# Patient Record
Sex: Male | Born: 2000 | Race: White | Hispanic: No | Marital: Single | Smoking: Never smoker
Health system: Southern US, Community
[De-identification: ages and names within clinical notes are randomized; demographics above are authoritative.]

## PROBLEM LIST (undated history)

## (undated) DIAGNOSIS — F32A Depression, unspecified: Secondary | ICD-10-CM

## (undated) DIAGNOSIS — F909 Attention-deficit hyperactivity disorder, unspecified type: Secondary | ICD-10-CM

## (undated) DIAGNOSIS — F329 Major depressive disorder, single episode, unspecified: Secondary | ICD-10-CM

---
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
## 2020-01-17 ENCOUNTER — Other Ambulatory Visit (HOSPITAL_BASED_OUTPATIENT_CLINIC_OR_DEPARTMENT_OTHER): Payer: Self-pay | Admitting: Neurosurgery

## 2020-01-17 DIAGNOSIS — G950 Syringomyelia and syringobulbia: Secondary | ICD-10-CM

## 2020-01-17 DIAGNOSIS — G935 Compression of brain: Secondary | ICD-10-CM

## 2020-01-25 ENCOUNTER — Encounter (HOSPITAL_BASED_OUTPATIENT_CLINIC_OR_DEPARTMENT_OTHER): Payer: Self-pay

## 2020-01-25 ENCOUNTER — Ambulatory Visit (HOSPITAL_BASED_OUTPATIENT_CLINIC_OR_DEPARTMENT_OTHER)
Admission: RE | Admit: 2020-01-25 | Discharge: 2020-01-25 | Disposition: A | Discharge status: Home / Self Care | Payer: BC Managed Care – PPO | Source: Ambulatory Visit | Attending: Neurosurgery | Admitting: Neurosurgery

## 2020-01-25 ENCOUNTER — Other Ambulatory Visit: Payer: Self-pay

## 2020-01-25 DIAGNOSIS — G935 Compression of brain: Secondary | ICD-10-CM

## 2020-01-25 DIAGNOSIS — G950 Syringomyelia and syringobulbia: Secondary | ICD-10-CM | POA: Insufficient documentation

## 2020-01-25 IMAGING — MR MR CERVICAL SPINE W/O CM
4 of 6 series · 27 of 48 positions shown · non-contrast
Comparison: None available.

CLINICAL DATA: Initial evaluation for Chiari malformation with
syrinx. Worsening balance and coordination.

EXAM:
MRI HEAD WITHOUT CONTRAST
MRI CERVICAL SPINE WITHOUT CONTRAST
MRI THORACIC SPINE WITHOUT CONTRAST
TECHNIQUE: Multiplanar, multiecho pulse sequences of the brain and surrounding
structures, and cervical spine, to include the craniocervical
junction and cervicothoracic junction, and thoracic spine were
obtained without intravenous contrast.

[Series 2: (id) tse sag · sagittal · 3.0mm · 0.41mm/px · 5 of 15 slices shown]
[im 1/15]
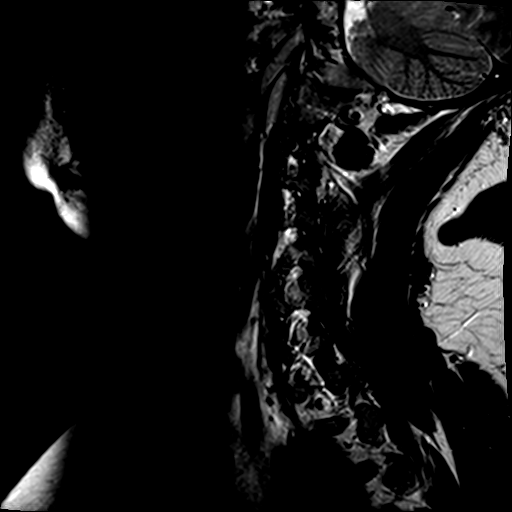
[im 4/15]
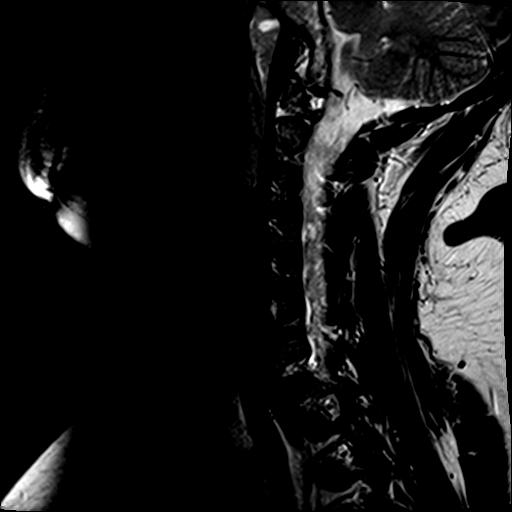
[im 8/15]
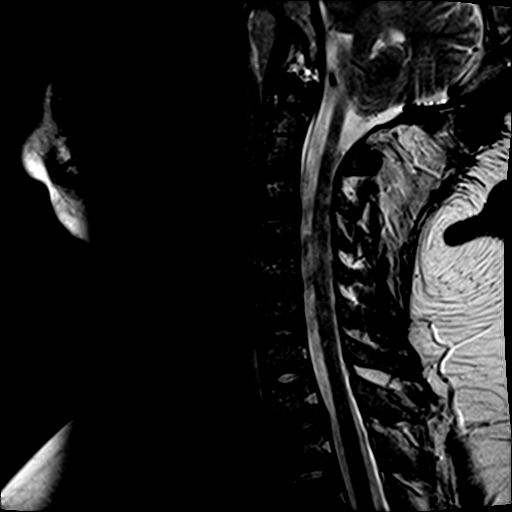
[im 11/15]
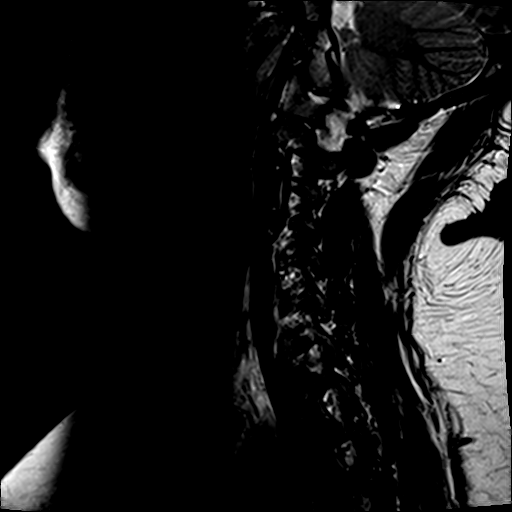
[im 15/15]
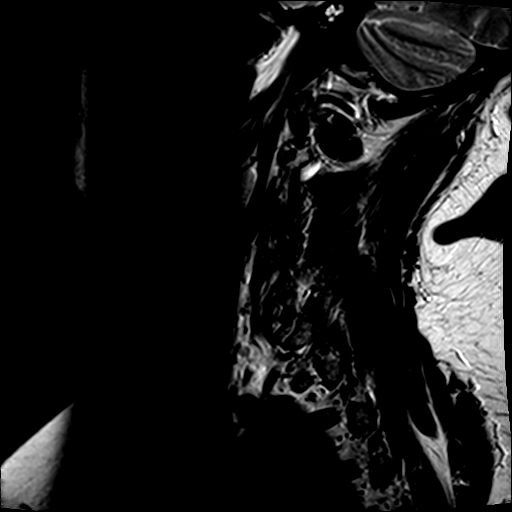

[Series 4: STIR · sagittal · 3.0mm · 0.82mm/px · 6 of 15 slices shown]
[im 1/15]
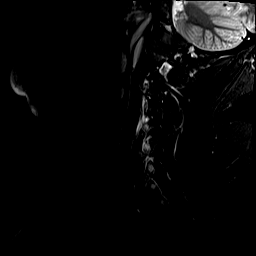
[im 3/15]
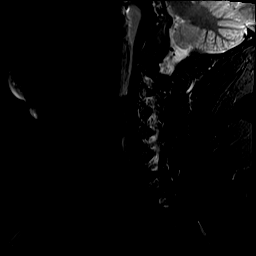
[im 6/15]
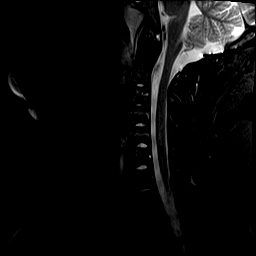
[im 9/15]
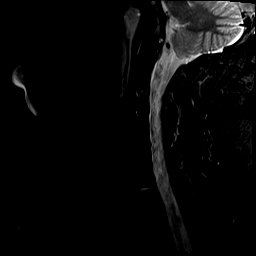
[im 12/15]
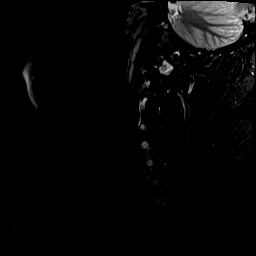
[im 15/15]
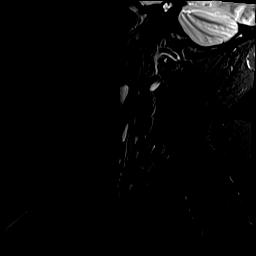

[Series 5: T2 · axial · 3.0mm · 0.39mm/px · z∈[-246,-116]mm · 8 of 38 slices shown (1 of 2)]
[im 1/38]
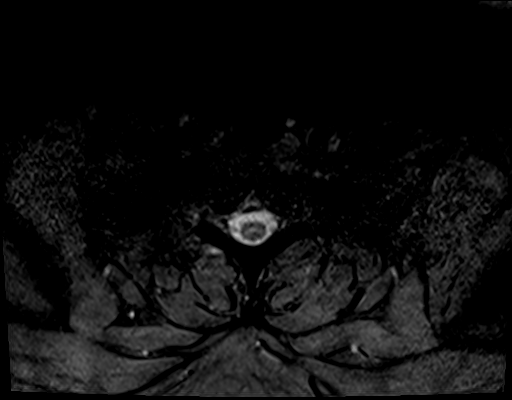
[im 6/38]
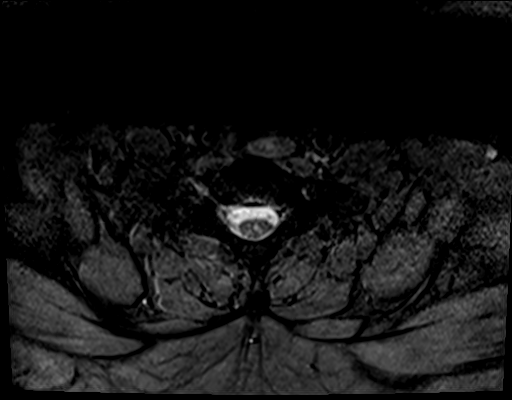
[im 12/38]
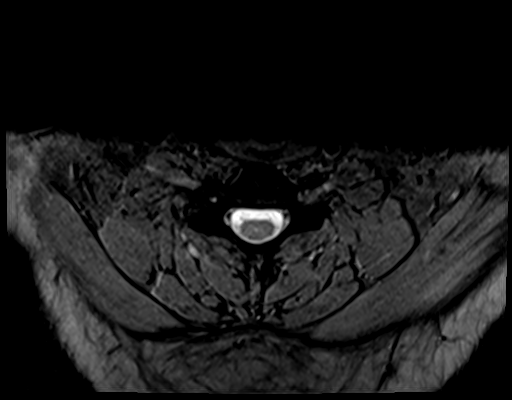
[im 18/38]
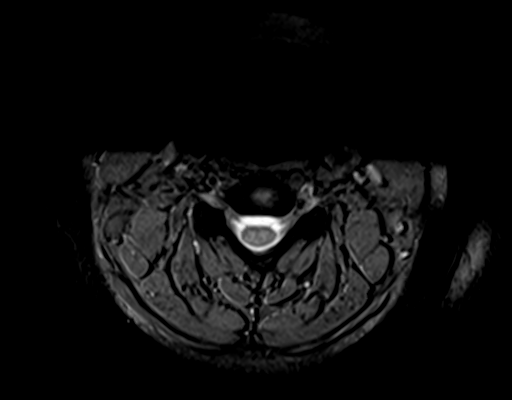
[im 20/38]
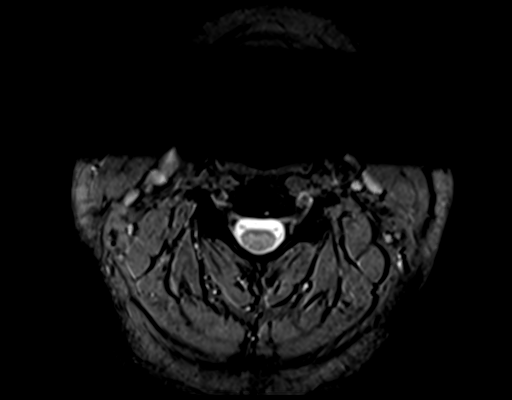
[im 26/38]
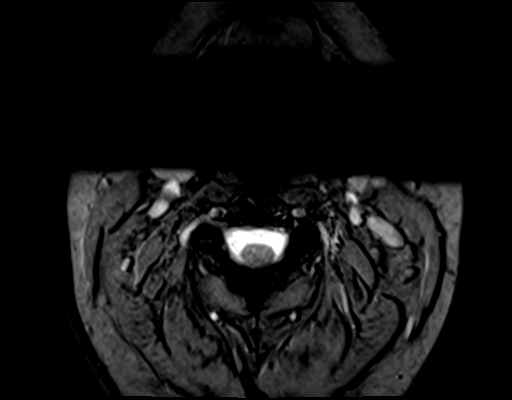
[im 32/38]
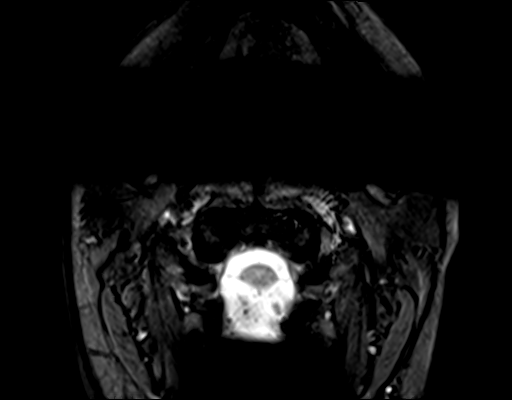
[im 38/38]
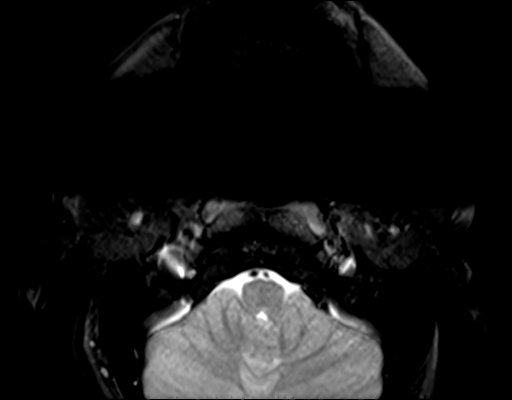

[Series 6: T2 · axial · 3.0mm · 0.78mm/px · z∈[-251,-121]mm · 8 of 38 slices shown (2 of 2)]
[im 1/38]
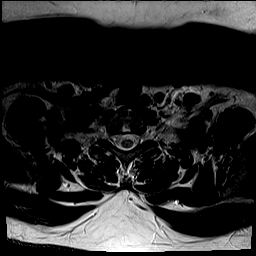
[im 6/38]
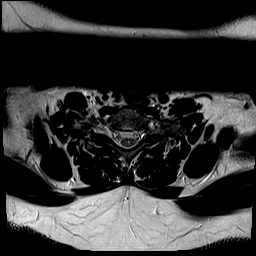
[im 12/38]
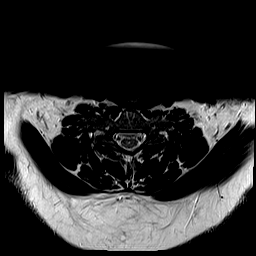
[im 18/38]
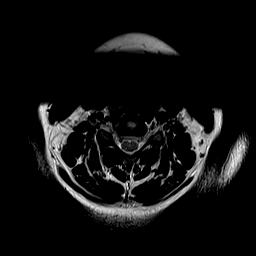
[im 20/38]
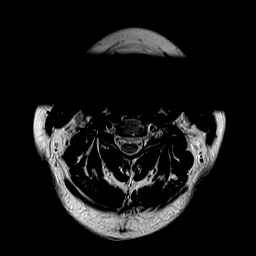
[im 26/38]
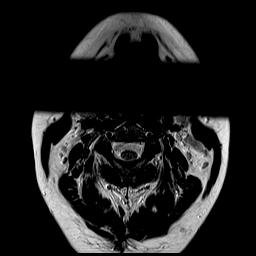
[im 32/38]
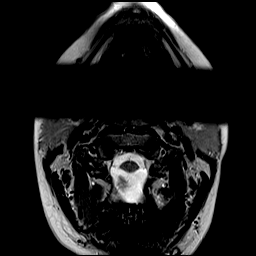
[im 38/38]
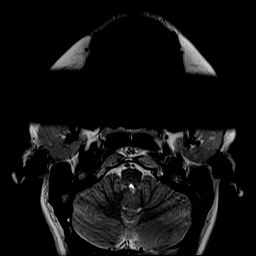

[27 of 48 positions shown; findings below may reference images not displayed]

FINDINGS: MRI HEAD FINDINGS

Brain: Cerebral volume within normal limits for age. No focal
parenchymal signal abnormality. Mildly prominent symmetric T2/FLAIR
signal intensity seen extending along the cortical spinal tracts
bilaterally felt to be technical in nature related to this exam.

No abnormal foci of restricted diffusion to suggest acute or
subacute ischemia. Gray-white matter differentiation maintained. No
encephalomalacia to suggest chronic cortical infarction. No foci of
susceptibility artifact to suggest acute or chronic intracranial
hemorrhage.

No mass lesion, midline shift or mass effect. No hydrocephalus or
extra-axial fluid collection. Pituitary gland suprasellar region
normal. Midline structures intact.

Vascular: Major intracranial vascular flow voids are well
maintained.

Skull and upper cervical spine: Chiari 1 malformation with the
cerebellar tonsils extending up to 1.6 cm through the postoperative
foramen magnum. Postoperative changes from prior suboccipital
craniectomy and C1 laminectomy are noted. Fairly adequate patency
seen at the craniocervical junction.

Bone marrow signal intensity within normal limits. No other scalp
soft tissue abnormality.

Sinuses/Orbits: Globes and orbital soft tissues within normal
limits. Scattered mucosal thickening noted within the sphenoid
ethmoidal sinuses and maxillary sinuses, likely
allergic/inflammatory nature. Mastoid air cells are clear. Inner ear
structures grossly normal.

Other: None.

MRI CERVICAL SPINE FINDINGS

Alignment: Mild straightening of the normal cervical lordosis. No
listhesis or static subluxation.

Vertebrae: Vertebral body height maintained without acute or chronic
fracture. Bone marrow signal intensity normal. No discrete or
worrisome osseous lesions. No abnormal marrow edema.

Cord: Small syrinx extending from the C5 through C7 levels is seen.
Syrinx measures up to 3.8 cm in length/craniocaudad dimension.
Syrinx measures up to 3.4 mm in maximal diameter at the level of
C6-7. Signal intensity within the cervical spinal cord is otherwise
normal. Underlying cord caliber and morphology normal as well.

Posterior Fossa, vertebral arteries, paraspinal tissues: Prior
suboccipital craniectomy with C1 noted ectomy for Chiari
malformation. Chronic postoperative scarring noted within the upper
posterior paraspinous soft tissues. Paraspinous soft tissues
demonstrate no acute finding. Normal flow voids seen within the
vertebral arteries bilaterally.

Disc levels: No significant disc pathology seen within the cervical
spine. Intervertebral discs are well hydrated with preserved disc
height. No disc bulge or focal disc herniation. No significant facet
disease. No canal or neural foraminal stenosis or evidence for
neural impingement.

MRI THORACIC SPINE FINDINGS

Alignment: Physiologic with preservation of the normal thoracic
kyphosis. No listhesis.

Vertebrae: Vertebral body height well maintained without acute or
chronic fracture. Bone marrow signal intensity within normal limits.
No discrete or worrisome osseous lesions. No abnormal marrow edema.

Cord: Normal signal and morphology. No syrinx or other structural
abnormality.

Paraspinal soft tissues: Paraspinous soft tissues within normal
limits. Partially visualized lungs are clear. Visualized visceral
structures unremarkable.

Disc levels:

T1-2: Small right subarticular disc protrusion mildly indents the
right ventral thecal sac (series 8, image 2). Associated slight
inferior migration. No significant stenosis or cord deformity.
Foramina remain widely patent.

T6-7: Small left subarticular disc protrusion mildly indents the
left ventral thecal sac, contacting the left ventral cord without
significant cord deformity or stenosis (series 8, image 16).
Associated slight superior migration. No significant spinal
stenosis. Foramina remain patent.

No other significant disc pathology seen within the thoracic spine.
Intervertebral discs are well hydrated with preserved disc height.
No other stenosis or neural impingement.
IMPRESSION: 1. Chiari 1 malformation with the cerebellar tonsils extending up to
1.6 cm through the postoperative foramen magnum. Patient is status
post suboccipital craniectomy and C1 laminectomy with good
postoperative patency at the foramen magnum.
2. Small syrinx extending from the C5 through C7 levels measuring up
to 3.4 mm in maximal diameter at the level of C6-7.
3. Small disc protrusions at T1-2 and T6-7 as above without
significant stenosis.
4. Otherwise unremarkable MRI of the brain, cervical, and thoracic
spine. No other acute intracranial abnormality.

## 2020-01-25 IMAGING — MR MR THORACIC SPINE W/O CM
4 of 9 series · 21 of 48 positions shown · non-contrast
Comparison: None available.

CLINICAL DATA: Initial evaluation for Chiari malformation with
syrinx. Worsening balance and coordination.

EXAM:
MRI HEAD WITHOUT CONTRAST
MRI CERVICAL SPINE WITHOUT CONTRAST
MRI THORACIC SPINE WITHOUT CONTRAST
TECHNIQUE: Multiplanar, multiecho pulse sequences of the brain and surrounding
structures, and cervical spine, to include the craniocervical
junction and cervicothoracic junction, and thoracic spine were
obtained without intravenous contrast.

[Series 2: T1 · sagittal · 3.0mm · 1.19mm/px · 3 of 11 slices shown]
[im 1/11]
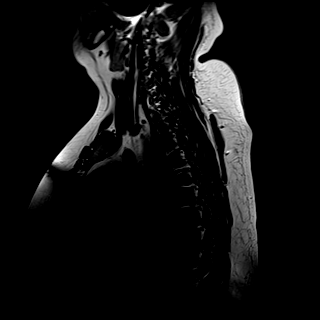
[im 6/11]
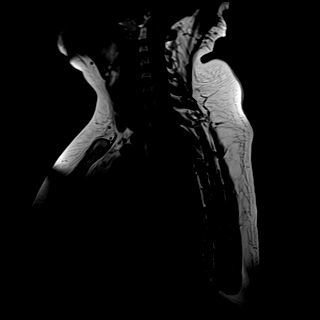
[im 11/11]
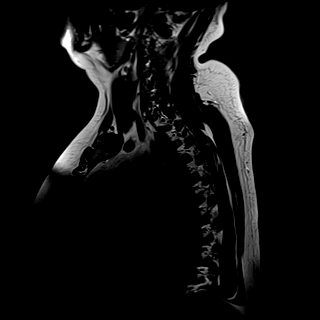

[Series 3: T2 · sagittal · 3.0mm · 1.00mm/px · 6 of 17 slices shown (1 of 3)]
[im 1/17]
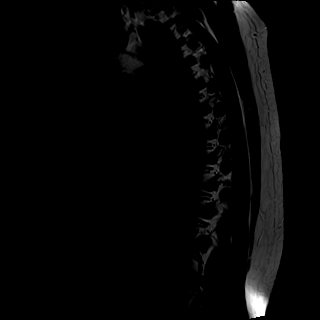
[im 4/17]
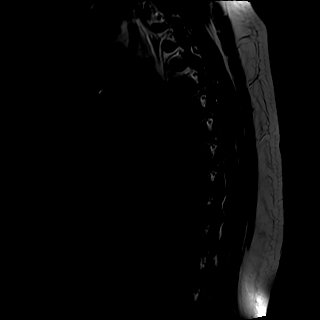
[im 7/17]
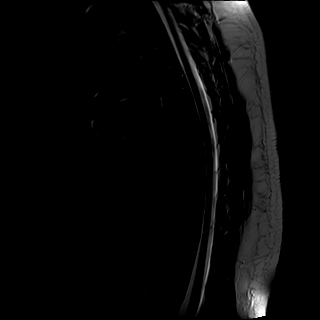
[im 10/17]
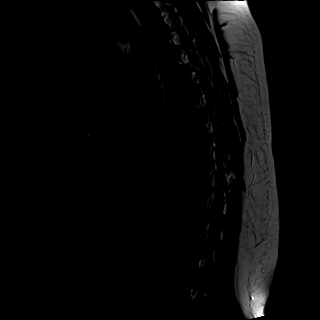
[im 13/17]
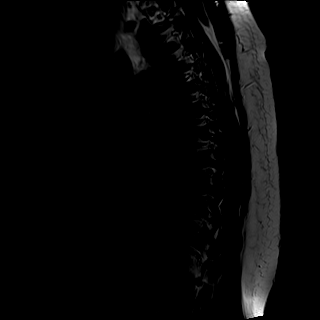
[im 17/17]
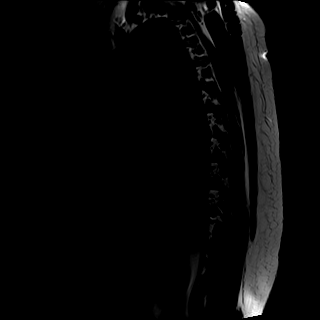

[Series 6: T2 · axial · 4.0mm · 0.39mm/px · z∈[-358,-264]mm · 6 of 18 slices shown (2 of 3)]
[im 1/18]
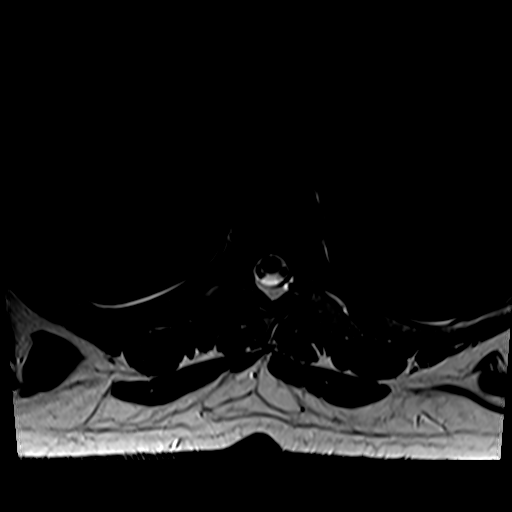
[im 4/18]
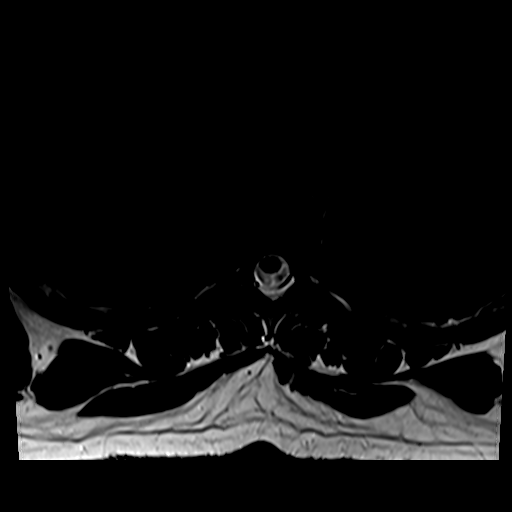
[im 7/18]
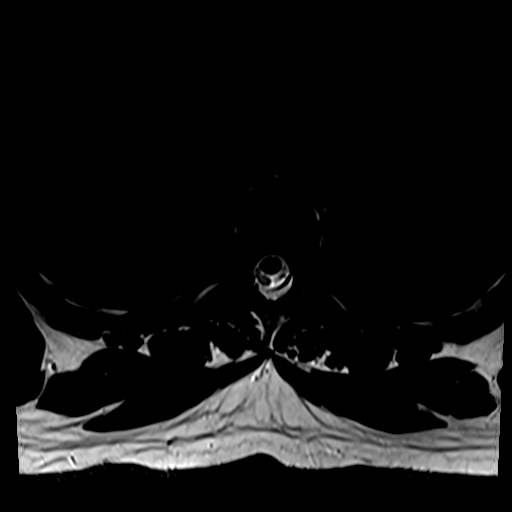
[im 11/18]
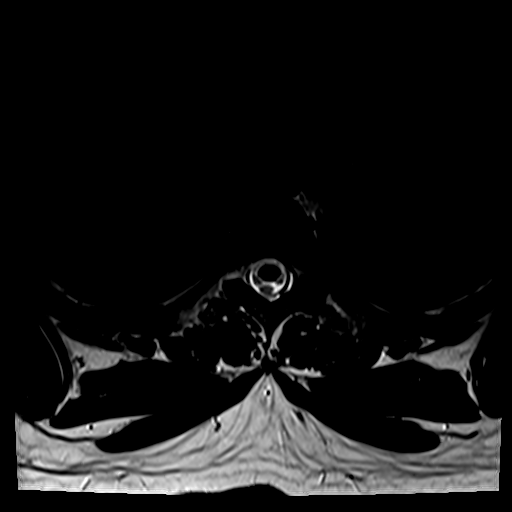
[im 14/18]
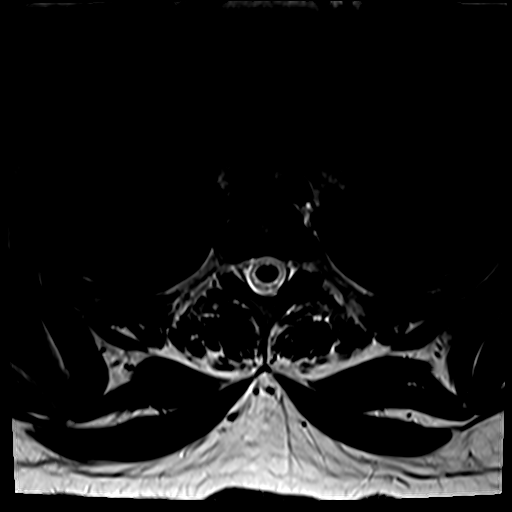
[im 18/18]
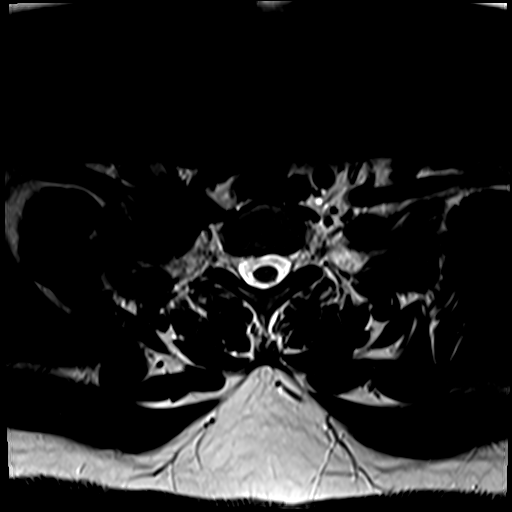

[Series 7: T2 · axial · 4.0mm · 0.39mm/px · z∈[-483,-365]mm · 6 of 18 slices shown (3 of 3)]
[im 1/18]
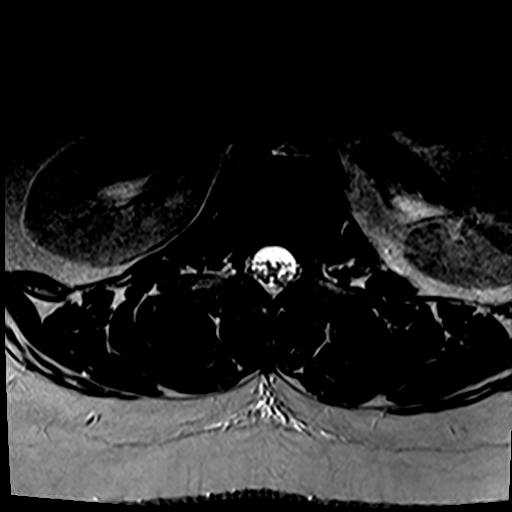
[im 4/18]
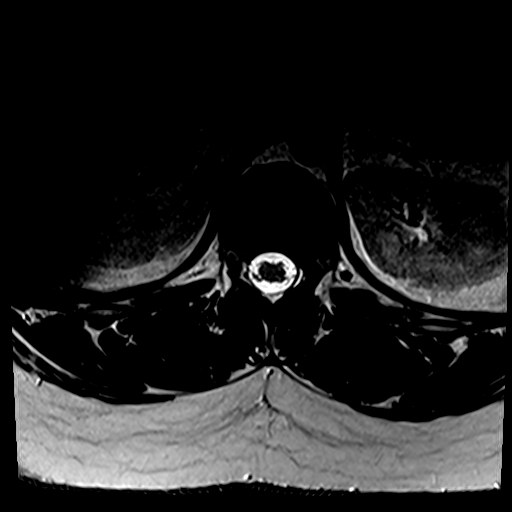
[im 7/18]
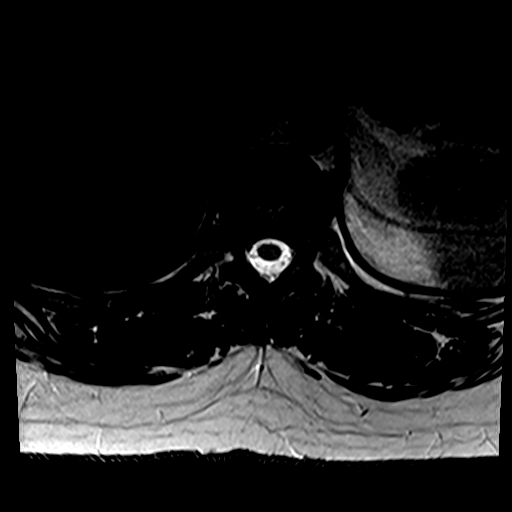
[im 11/18]
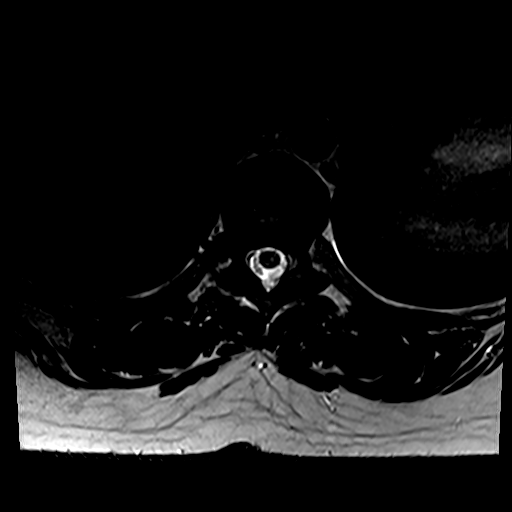
[im 14/18]
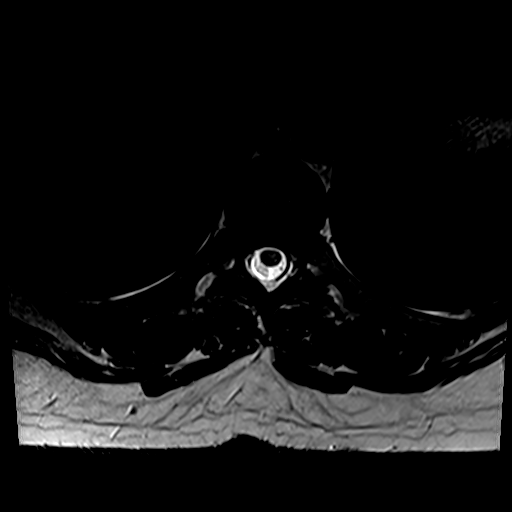
[im 18/18]
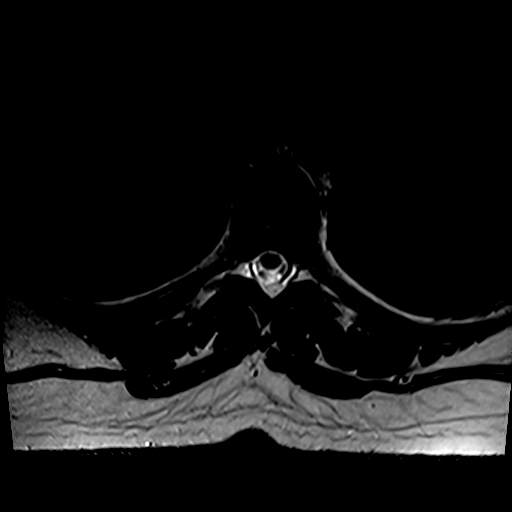

[21 of 48 positions shown; findings below may reference images not displayed]

FINDINGS: MRI HEAD FINDINGS

Brain: Cerebral volume within normal limits for age. No focal
parenchymal signal abnormality. Mildly prominent symmetric T2/FLAIR
signal intensity seen extending along the cortical spinal tracts
bilaterally felt to be technical in nature related to this exam.

No abnormal foci of restricted diffusion to suggest acute or
subacute ischemia. Gray-white matter differentiation maintained. No
encephalomalacia to suggest chronic cortical infarction. No foci of
susceptibility artifact to suggest acute or chronic intracranial
hemorrhage.

No mass lesion, midline shift or mass effect. No hydrocephalus or
extra-axial fluid collection. Pituitary gland suprasellar region
normal. Midline structures intact.

Vascular: Major intracranial vascular flow voids are well
maintained.

Skull and upper cervical spine: Chiari 1 malformation with the
cerebellar tonsils extending up to 1.6 cm through the postoperative
foramen magnum. Postoperative changes from prior suboccipital
craniectomy and C1 laminectomy are noted. Fairly adequate patency
seen at the craniocervical junction.

Bone marrow signal intensity within normal limits. No other scalp
soft tissue abnormality.

Sinuses/Orbits: Globes and orbital soft tissues within normal
limits. Scattered mucosal thickening noted within the sphenoid
ethmoidal sinuses and maxillary sinuses, likely
allergic/inflammatory nature. Mastoid air cells are clear. Inner ear
structures grossly normal.

Other: None.

MRI CERVICAL SPINE FINDINGS

Alignment: Mild straightening of the normal cervical lordosis. No
listhesis or static subluxation.

Vertebrae: Vertebral body height maintained without acute or chronic
fracture. Bone marrow signal intensity normal. No discrete or
worrisome osseous lesions. No abnormal marrow edema.

Cord: Small syrinx extending from the C5 through C7 levels is seen.
Syrinx measures up to 3.8 cm in length/craniocaudad dimension.
Syrinx measures up to 3.4 mm in maximal diameter at the level of
C6-7. Signal intensity within the cervical spinal cord is otherwise
normal. Underlying cord caliber and morphology normal as well.

Posterior Fossa, vertebral arteries, paraspinal tissues: Prior
suboccipital craniectomy with C1 noted ectomy for Chiari
malformation. Chronic postoperative scarring noted within the upper
posterior paraspinous soft tissues. Paraspinous soft tissues
demonstrate no acute finding. Normal flow voids seen within the
vertebral arteries bilaterally.

Disc levels: No significant disc pathology seen within the cervical
spine. Intervertebral discs are well hydrated with preserved disc
height. No disc bulge or focal disc herniation. No significant facet
disease. No canal or neural foraminal stenosis or evidence for
neural impingement.

MRI THORACIC SPINE FINDINGS

Alignment: Physiologic with preservation of the normal thoracic
kyphosis. No listhesis.

Vertebrae: Vertebral body height well maintained without acute or
chronic fracture. Bone marrow signal intensity within normal limits.
No discrete or worrisome osseous lesions. No abnormal marrow edema.

Cord: Normal signal and morphology. No syrinx or other structural
abnormality.

Paraspinal soft tissues: Paraspinous soft tissues within normal
limits. Partially visualized lungs are clear. Visualized visceral
structures unremarkable.

Disc levels:

T1-2: Small right subarticular disc protrusion mildly indents the
right ventral thecal sac (series 8, image 2). Associated slight
inferior migration. No significant stenosis or cord deformity.
Foramina remain widely patent.

T6-7: Small left subarticular disc protrusion mildly indents the
left ventral thecal sac, contacting the left ventral cord without
significant cord deformity or stenosis (series 8, image 16).
Associated slight superior migration. No significant spinal
stenosis. Foramina remain patent.

No other significant disc pathology seen within the thoracic spine.
Intervertebral discs are well hydrated with preserved disc height.
No other stenosis or neural impingement.
IMPRESSION: 1. Chiari 1 malformation with the cerebellar tonsils extending up to
1.6 cm through the postoperative foramen magnum. Patient is status
post suboccipital craniectomy and C1 laminectomy with good
postoperative patency at the foramen magnum.
2. Small syrinx extending from the C5 through C7 levels measuring up
to 3.4 mm in maximal diameter at the level of C6-7.
3. Small disc protrusions at T1-2 and T6-7 as above without
significant stenosis.
4. Otherwise unremarkable MRI of the brain, cervical, and thoracic
spine. No other acute intracranial abnormality.

## 2020-01-25 IMAGING — MR MR HEAD W/O CM
10 of 11 series · 35 of 48 positions shown · non-contrast
Comparison: None available.

CLINICAL DATA: Initial evaluation for Chiari malformation with
syrinx. Worsening balance and coordination.

EXAM:
MRI HEAD WITHOUT CONTRAST
MRI CERVICAL SPINE WITHOUT CONTRAST
MRI THORACIC SPINE WITHOUT CONTRAST
TECHNIQUE: Multiplanar, multiecho pulse sequences of the brain and surrounding
structures, and cervical spine, to include the craniocervical
junction and cervicothoracic junction, and thoracic spine were
obtained without intravenous contrast.

[Series 2: T1 · sagittal · 5.0mm · 0.47mm/px · 1 of 23 slices shown]
[im 1/23]
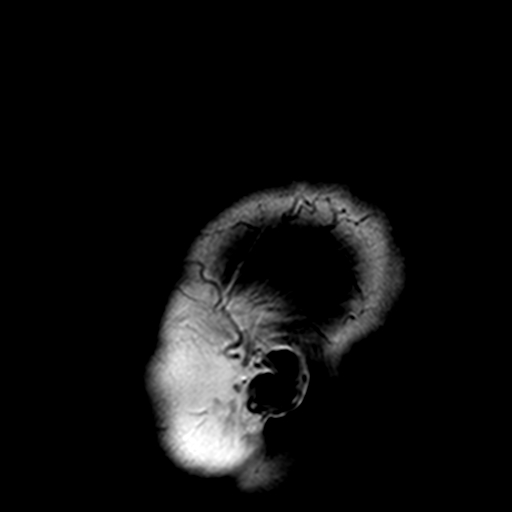

[Series 3: DWI · axial · 3.0mm · 2.00mm/px · z∈[-79,+82]mm · 9 of 104 slices shown (1 of 4)]
[im 1/104]
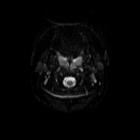
[im 13/104]
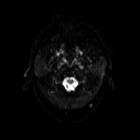
[im 26/104]
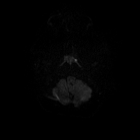
[im 39/104]
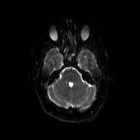
[im 52/104]
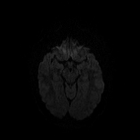
[im 65/104]
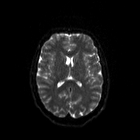
[im 78/104]
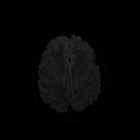
[im 91/104]
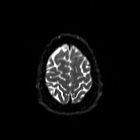
[im 104/104]
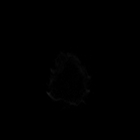

[Series 4: DWI · axial · 3.0mm · 2.00mm/px · z∈[-79,+82]mm · 4 of 51 slices shown (2 of 4)]
[im 1/51]
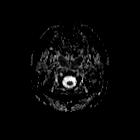
[im 17/51]
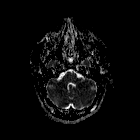
[im 34/51]
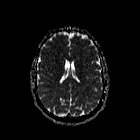
[im 51/51]
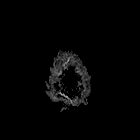

[Series 5: DWI · coronal · 5.0mm · 1.46mm/px · 6 of 76 slices shown (3 of 4)]
[im 1/76]
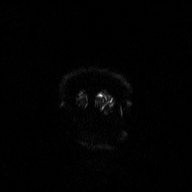
[im 16/76]
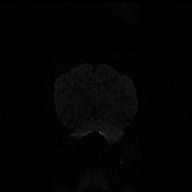
[im 31/76]
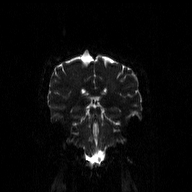
[im 46/76]
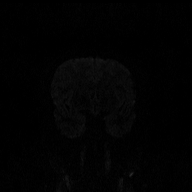
[im 61/76]
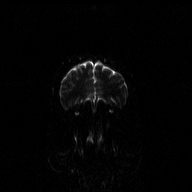
[im 76/76]
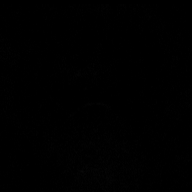

[Series 6: DWI · coronal · 5.0mm · 1.46mm/px · 3 of 38 slices shown (4 of 4)]
[im 1/38]
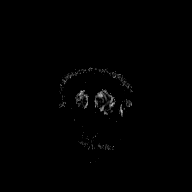
[im 19/38]
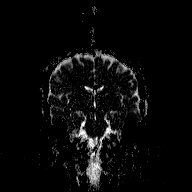
[im 38/38]
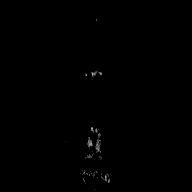

[Series 7: T2 · axial · 5.0mm · 0.45mm/px · z∈[-90,+67]mm · 2 of 24 slices shown (1 of 2)]
[im 1/24]
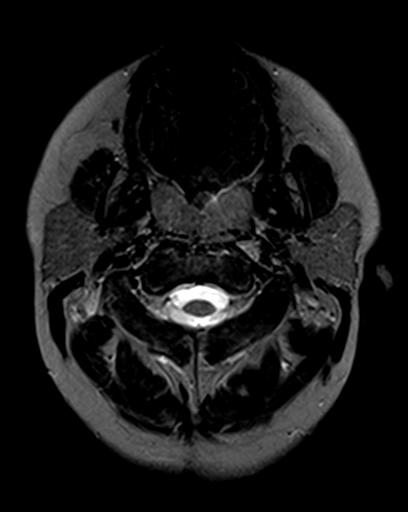
[im 24/24]
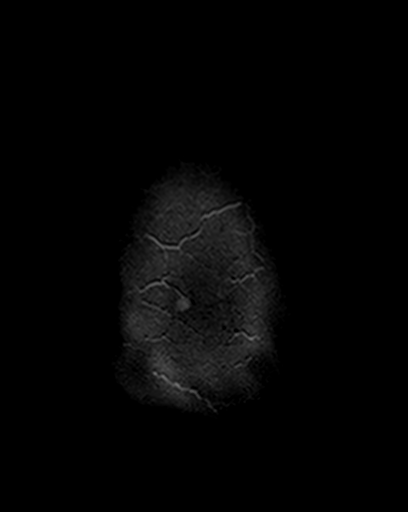

[Series 8: T2 · axial · 5.0mm · 0.45mm/px · z∈[-90,+67]mm · 2 of 24 slices shown (2 of 2)]
[im 1/24]
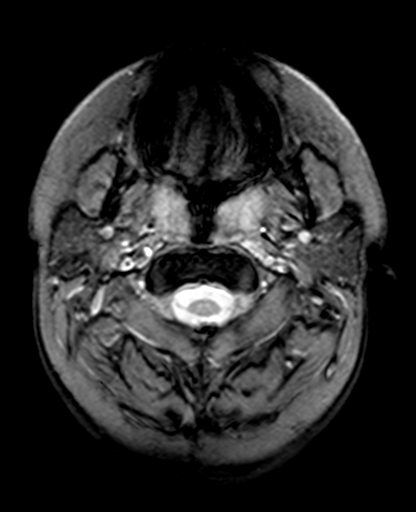
[im 24/24]
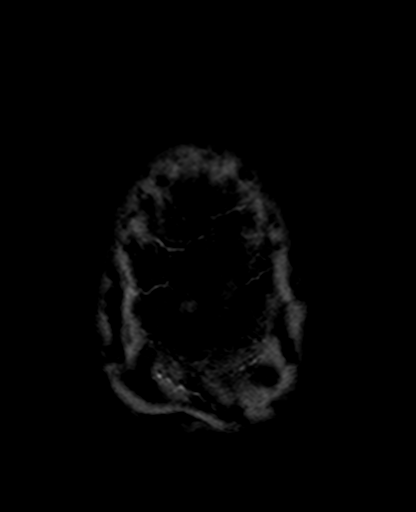

[Series 9: FLAIR · axial · 3.0mm · 0.90mm/px · z∈[-95,+72]mm · 4 of 44 slices shown]
[im 1/44]
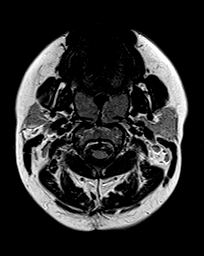
[im 15/44]
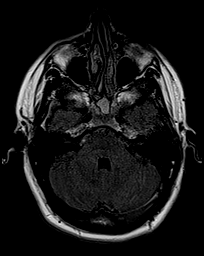
[im 29/44]
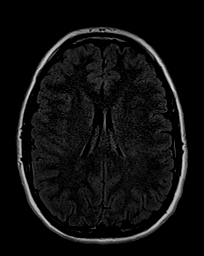
[im 44/44]
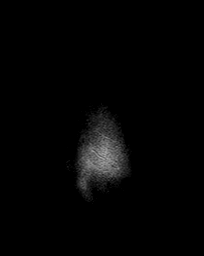

[Series 11: T2 post-contrast · coronal · 5.0mm · 0.72mm/px · 3 of 34 slices shown]
[im 1/34]
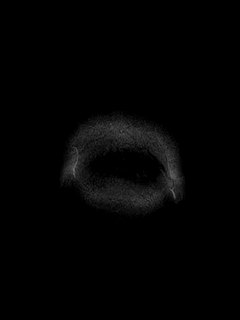
[im 17/34]
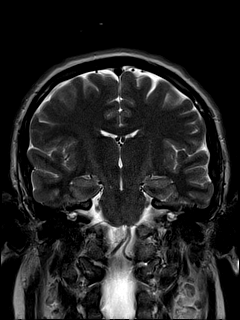
[im 34/34]
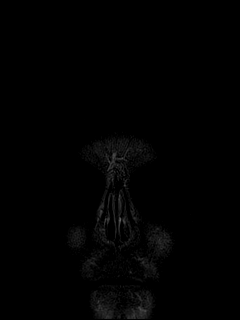

[Series 100: hx · axial · 10.0mm · 0.55mm/px · 1 of 9 slices shown]
[im 1/9]
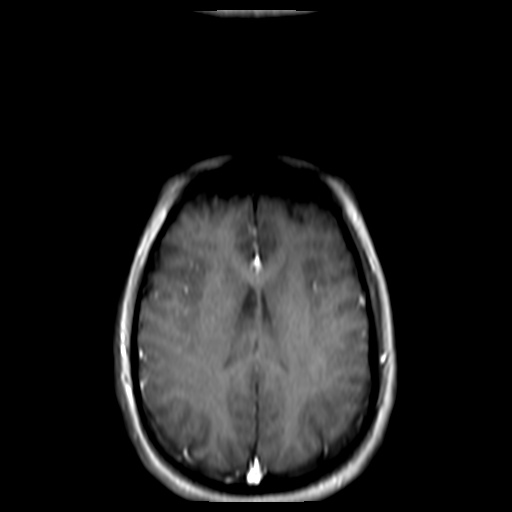

[35 of 48 positions shown; findings below may reference images not displayed]

FINDINGS: MRI HEAD FINDINGS

Brain: Cerebral volume within normal limits for age. No focal
parenchymal signal abnormality. Mildly prominent symmetric T2/FLAIR
signal intensity seen extending along the cortical spinal tracts
bilaterally felt to be technical in nature related to this exam.

No abnormal foci of restricted diffusion to suggest acute or
subacute ischemia. Gray-white matter differentiation maintained. No
encephalomalacia to suggest chronic cortical infarction. No foci of
susceptibility artifact to suggest acute or chronic intracranial
hemorrhage.

No mass lesion, midline shift or mass effect. No hydrocephalus or
extra-axial fluid collection. Pituitary gland suprasellar region
normal. Midline structures intact.

Vascular: Major intracranial vascular flow voids are well
maintained.

Skull and upper cervical spine: Chiari 1 malformation with the
cerebellar tonsils extending up to 1.6 cm through the postoperative
foramen magnum. Postoperative changes from prior suboccipital
craniectomy and C1 laminectomy are noted. Fairly adequate patency
seen at the craniocervical junction.

Bone marrow signal intensity within normal limits. No other scalp
soft tissue abnormality.

Sinuses/Orbits: Globes and orbital soft tissues within normal
limits. Scattered mucosal thickening noted within the sphenoid
ethmoidal sinuses and maxillary sinuses, likely
allergic/inflammatory nature. Mastoid air cells are clear. Inner ear
structures grossly normal.

Other: None.

MRI CERVICAL SPINE FINDINGS

Alignment: Mild straightening of the normal cervical lordosis. No
listhesis or static subluxation.

Vertebrae: Vertebral body height maintained without acute or chronic
fracture. Bone marrow signal intensity normal. No discrete or
worrisome osseous lesions. No abnormal marrow edema.

Cord: Small syrinx extending from the C5 through C7 levels is seen.
Syrinx measures up to 3.8 cm in length/craniocaudad dimension.
Syrinx measures up to 3.4 mm in maximal diameter at the level of
C6-7. Signal intensity within the cervical spinal cord is otherwise
normal. Underlying cord caliber and morphology normal as well.

Posterior Fossa, vertebral arteries, paraspinal tissues: Prior
suboccipital craniectomy with C1 noted ectomy for Chiari
malformation. Chronic postoperative scarring noted within the upper
posterior paraspinous soft tissues. Paraspinous soft tissues
demonstrate no acute finding. Normal flow voids seen within the
vertebral arteries bilaterally.

Disc levels: No significant disc pathology seen within the cervical
spine. Intervertebral discs are well hydrated with preserved disc
height. No disc bulge or focal disc herniation. No significant facet
disease. No canal or neural foraminal stenosis or evidence for
neural impingement.

MRI THORACIC SPINE FINDINGS

Alignment: Physiologic with preservation of the normal thoracic
kyphosis. No listhesis.

Vertebrae: Vertebral body height well maintained without acute or
chronic fracture. Bone marrow signal intensity within normal limits.
No discrete or worrisome osseous lesions. No abnormal marrow edema.

Cord: Normal signal and morphology. No syrinx or other structural
abnormality.

Paraspinal soft tissues: Paraspinous soft tissues within normal
limits. Partially visualized lungs are clear. Visualized visceral
structures unremarkable.

Disc levels:

T1-2: Small right subarticular disc protrusion mildly indents the
right ventral thecal sac (series 8, image 2). Associated slight
inferior migration. No significant stenosis or cord deformity.
Foramina remain widely patent.

T6-7: Small left subarticular disc protrusion mildly indents the
left ventral thecal sac, contacting the left ventral cord without
significant cord deformity or stenosis (series 8, image 16).
Associated slight superior migration. No significant spinal
stenosis. Foramina remain patent.

No other significant disc pathology seen within the thoracic spine.
Intervertebral discs are well hydrated with preserved disc height.
No other stenosis or neural impingement.
IMPRESSION: 1. Chiari 1 malformation with the cerebellar tonsils extending up to
1.6 cm through the postoperative foramen magnum. Patient is status
post suboccipital craniectomy and C1 laminectomy with good
postoperative patency at the foramen magnum.
2. Small syrinx extending from the C5 through C7 levels measuring up
to 3.4 mm in maximal diameter at the level of C6-7.
3. Small disc protrusions at T1-2 and T6-7 as above without
significant stenosis.
4. Otherwise unremarkable MRI of the brain, cervical, and thoracic
spine. No other acute intracranial abnormality.

## 2022-06-21 ENCOUNTER — Encounter: Payer: Self-pay | Admitting: *Deleted
